# Patient Record
Sex: Female | Born: 1960 | Race: White | Hispanic: No
Health system: Southern US, Community
[De-identification: ages and names within clinical notes are randomized; demographics above are authoritative.]

## PROBLEM LIST (undated history)

## (undated) DIAGNOSIS — F419 Anxiety disorder, unspecified: Secondary | ICD-10-CM

---

## 2018-09-25 DIAGNOSIS — D239 Other benign neoplasm of skin, unspecified: Secondary | ICD-10-CM

## 2018-09-25 HISTORY — DX: Other benign neoplasm of skin, unspecified: D23.9

## 2018-10-21 ENCOUNTER — Other Ambulatory Visit: Payer: Self-pay

## 2018-10-21 DIAGNOSIS — Z20822 Contact with and (suspected) exposure to covid-19: Secondary | ICD-10-CM

## 2018-10-23 LAB — NOVEL CORONAVIRUS, NAA: SARS-CoV-2, NAA: NOT DETECTED

## 2019-03-07 ENCOUNTER — Ambulatory Visit: Payer: Self-pay | Attending: Internal Medicine

## 2019-03-07 DIAGNOSIS — Z23 Encounter for immunization: Secondary | ICD-10-CM | POA: Insufficient documentation

## 2019-03-07 NOTE — Progress Notes (Signed)
   Covid-19 Vaccination Clinic  Name:  Tammy Haynes    MRN: [id number] DOB: May 08, 1960  03/07/2019  [name] was observed post Covid-19 immunization for 15 minutes without incident. She was provided with Vaccine Information Sheet and instruction to access the V-Safe system.   [name] was instructed to call 911 with any severe reactions post vaccine: Marland Kitchen Difficulty breathing  . Swelling of face and throat  . A fast heartbeat  . A bad rash all over body  . Dizziness and weakness

## 2019-04-02 ENCOUNTER — Ambulatory Visit: Payer: Self-pay | Attending: Internal Medicine

## 2019-04-02 DIAGNOSIS — Z23 Encounter for immunization: Secondary | ICD-10-CM

## 2019-04-02 NOTE — Progress Notes (Signed)
   Covid-19 Vaccination Clinic  Name:  Tammy Haynes    MRN: [id number] DOB: 1960-07-13  04/02/2019  Tammy Haynes was observed post Covid-19 immunization for 15 minutes without incident. She was provided with Vaccine Information Sheet and instruction to access the V-Safe system.   Tammy Haynes was instructed to call 911 with any severe reactions post vaccine: Marland Kitchen Difficulty breathing  . Swelling of face and throat  . A fast heartbeat  . A bad rash all over body  . Dizziness and weakness   Immunizations Administered    Name Date Dose VIS Date Route   Pfizer COVID-19 Vaccine 04/02/2019 11:20 AM 0.3 mL 12/13/2018 Intramuscular   Manufacturer: [place]   Lot: [id number]   [place]: [id number]

## 2019-10-06 ENCOUNTER — Ambulatory Visit: Attending: Internal Medicine

## 2019-10-06 DIAGNOSIS — Z23 Encounter for immunization: Secondary | ICD-10-CM

## 2019-10-06 NOTE — Progress Notes (Signed)
   Covid-19 Vaccination Clinic  Name:  Tammy Haynes    MRN: [id number] DOB: 1960/10/01  10/06/2019  [name] was observed post Covid-19 immunization for 15 minutes without incident. She was provided with Vaccine Information Sheet and instruction to access the V-Safe system.   [name] was instructed to call 911 with any severe reactions post vaccine: Marland Kitchen Difficulty breathing  . Swelling of face and throat  . A fast heartbeat  . A bad rash all over body  . Dizziness and weakness

## 2019-10-09 ENCOUNTER — Other Ambulatory Visit: Payer: Self-pay

## 2019-10-09 ENCOUNTER — Ambulatory Visit (INDEPENDENT_AMBULATORY_CARE_PROVIDER_SITE_OTHER): Admitting: Dermatology

## 2019-10-09 ENCOUNTER — Encounter: Payer: Self-pay | Admitting: Dermatology

## 2019-10-09 DIAGNOSIS — D2362 Other benign neoplasm of skin of left upper limb, including shoulder: Secondary | ICD-10-CM | POA: Diagnosis not present

## 2019-10-09 DIAGNOSIS — D2262 Melanocytic nevi of left upper limb, including shoulder: Secondary | ICD-10-CM

## 2019-10-09 DIAGNOSIS — D229 Melanocytic nevi, unspecified: Secondary | ICD-10-CM

## 2019-10-09 DIAGNOSIS — L719 Rosacea, unspecified: Secondary | ICD-10-CM | POA: Diagnosis not present

## 2019-10-09 DIAGNOSIS — D1800 Hemangioma unspecified site: Secondary | ICD-10-CM

## 2019-10-09 DIAGNOSIS — Z1283 Encounter for screening for malignant neoplasm of skin: Secondary | ICD-10-CM

## 2019-10-09 DIAGNOSIS — L814 Other melanin hyperpigmentation: Secondary | ICD-10-CM

## 2019-10-09 DIAGNOSIS — D239 Other benign neoplasm of skin, unspecified: Secondary | ICD-10-CM

## 2019-10-09 DIAGNOSIS — L578 Other skin changes due to chronic exposure to nonionizing radiation: Secondary | ICD-10-CM

## 2019-10-09 DIAGNOSIS — L821 Other seborrheic keratosis: Secondary | ICD-10-CM

## 2019-10-09 DIAGNOSIS — Z86018 Personal history of other benign neoplasm: Secondary | ICD-10-CM

## 2019-10-09 DIAGNOSIS — D2372 Other benign neoplasm of skin of left lower limb, including hip: Secondary | ICD-10-CM

## 2019-10-09 NOTE — Progress Notes (Signed)
Follow-Up Visit   Subjective  Tammy Haynes is a 59 y.o. female who presents for the following: fbse.  Patient here for full body skin exam and skin cancer screening. Patient has a few concerns today on her left upper arm and forehead. Patient has no h/o skin cancer, however does have h/o Mod dysplastic nevus 09/25/18. She does have a rash at the mid-face for which she is not using medication. She reports some irritation of the eyes.  The following portions of the chart were reviewed this encounter and updated as appropriate:  Allergies   Meds   Problems   Med Hx   Surg Hx   Fam Hx       Review of Systems:  No other skin or systemic complaints except as noted in HPI or Assessment and Plan.  Objective  Well appearing patient in no apparent distress; mood and affect are within normal limits.  A full examination was performed including scalp, head, eyes, ears, nose, lips, neck, chest, axillae, abdomen, back, buttocks, bilateral upper extremities, bilateral lower extremities, hands, feet, fingers, toes, fingernails, and toenails. All findings within normal limits unless otherwise noted below.  Objective  Left Upper Arm - Anterior: 0.435 cm round thin brown papule  Objective  Mid face and forehead: Mid face erythema with telangiectasias with few scattered inflammatory papules.   Objective  Left Lower Leg - Anterior, Left Superior Shoulder: Firm pink/brown papulenodule with dimple sign.    Assessment & Plan  Nevus Left Upper Arm - Anterior  Benign-appearing.  Observation.  Call clinic for new or changing lesions.  Recommend daily use of broad spectrum spf 30+ sunscreen to sun-exposed areas.    Rosacea Mid face and forehead  Chronic, flared  Patient ws given Soolantra to try on face Patient states that she has dry irritated eyes. Recommend evaluation with ophthalmology for possible ocular rosacea. Start Doxycycline 20mg  BID  Doxycycline should be taken with food to  prevent nausea. Do not lay down for 30 minutes after taking. Be cautious with sun exposure and use good sun protection while on this medication. Pregnant women should not take this medication.    doxycycline (PERIOSTAT) 20 MG tablet - Mid face and forehead  Dermatofibroma (2) Left Superior Shoulder; Left Lower Leg - Anterior  Benign-appearing.  Observation.  Call clinic for new or changing lesions.  Recommend daily use of broad spectrum spf 30+ sunscreen to sun-exposed areas.     Lentigines - Scattered tan macules - Discussed due to sun exposure - Benign, observe - Call for any changes  Seborrheic Keratoses - Stuck-on, waxy, tan-brown papules and plaques  - Discussed benign etiology and prognosis. - Observe - Call for any changes  Melanocytic Nevi - Tan-brown and/or pink-flesh-colored symmetric macules and papules - Benign appearing on exam today - Observation - Call clinic for new or changing moles - Recommend daily use of broad spectrum spf 30+ sunscreen to sun-exposed areas.   Hemangiomas - Red papules - Discussed benign nature - Observe - Call for any changes  Actinic Damage - diffuse scaly erythematous macules with underlying dyspigmentation - Recommend daily broad spectrum sunscreen SPF 30+ to sun-exposed areas, reapply every 2 hours as needed.  - Call for new or changing lesions.  Skin cancer screening performed today.  History of Dysplastic Nevi - No evidence of recurrence today - Recommend regular full body skin exams - Recommend daily broad spectrum sunscreen SPF 30+ to sun-exposed areas, reapply every 2 hours as needed.  -  Call if any new or changing lesions are noted between office visits   Return in about 1 year (around 10/08/2020) for TBSE.  I, Donzetta Kohut, CMA, am acting as scribe for Forest Gleason, MD .  Documentation: I have reviewed the above documentation for accuracy and completeness, and I agree with the above.  Forest Gleason, MD

## 2019-10-09 NOTE — Patient Instructions (Addendum)
Recommend daily broad spectrum sunscreen SPF 30+ to sun-exposed areas, reapply every 2 hours as needed. Call for new or changing lesions.    Recommend taking Heliocare sun protection supplement daily in sunny weather for additional sun protection. For maximum protection on the sunniest days, you can take up to 2 capsules of regular Heliocare OR take 1 capsule of Heliocare Ultra. For prolonged exposure (such as a full day in the sun), you can repeat your dose of the supplement 4 hours after your first dose. Heliocare can be purchased at [facility] or at [email or web address].     Melanoma ABCDEs  Melanoma is the most dangerous type of skin cancer, and is the leading cause of death from skin disease.  You are more likely to develop melanoma if you:  Have light-colored skin, light-colored eyes, or red or blond hair  Spend a lot of time in the sun  Tan regularly, either outdoors or in a tanning bed  Have had blistering sunburns, especially during childhood  Have a close family member who has had a melanoma  Have atypical moles or large birthmarks  Early detection of melanoma is key since treatment is typically straightforward and cure rates are extremely high if we catch it early.   The first sign of melanoma is often a change in a mole or a new dark spot.  The ABCDE system is a way of remembering the signs of melanoma.  A for asymmetry:  The two halves do not match. B for border:  The edges of the growth are irregular. C for color:  A mixture of colors are present instead of an even brown color. D for diameter:  Melanomas are usually (but not always) greater than 6mm - the size of a pencil eraser. E for evolution:  The spot keeps changing in size, shape, and color.  Please check your skin once per month between visits. You can use a small mirror in front and a large mirror behind you to keep an eye on the back side or your body.   If you see any new or changing lesions before  your next follow-up, please call to schedule a visit.  Please continue daily skin protection including broad spectrum sunscreen SPF 30+ to sun-exposed areas, reapplying every 2 hours as needed when you're outdoors.    

## 2019-10-13 ENCOUNTER — Telehealth: Payer: Self-pay

## 2019-10-13 MED ORDER — DOXYCYCLINE HYCLATE 20 MG PO TABS: 20.0000 mg | ORAL_TABLET | Freq: Two times a day (BID) | ORAL | 1 refills | Status: AC

## 2019-10-13 NOTE — Telephone Encounter (Signed)
Patient states she was here last week and was given a sample of a rosacea cream that seems to be making her rosacea worse causing acne papules to come out. She also thought she was getting a Doxycycline RX. Can you please advise as I dont see this in the note?  [place]

## 2019-10-13 NOTE — Telephone Encounter (Signed)
Called patient and advised here that the doxycycline 50mg  should be at her pharmacy for her to pick up, was resent in today. Also advised her that if she was forehead was getting worse with the Soolantra to stop using but to take the Doxycycline 50mg  as soon as she picks it up

## 2019-10-14 NOTE — Telephone Encounter (Signed)
Thank you :)

## 2019-10-28 ENCOUNTER — Encounter: Payer: Self-pay | Admitting: Dermatology

## 2019-10-28 ENCOUNTER — Telehealth: Payer: Self-pay

## 2019-10-28 NOTE — Telephone Encounter (Signed)
Did she like the Wister sample we gave her to try?  If so, we can prescribe that and see if her insurance will cover it. Otherwise we could consider [facility] metronidazole ivermectin azelaic acid which is my favorite topical for rosacea- It is around $55 out-of-pocket.  I am fine with sending either for her.  One final option just so she knows it is available - there is a slow release doxycycline called Oracea that usually causes less stomach upset and her insurance may pay for but I cannot guarantee how well she would tolerate it. If we have any samples, we could offer her a sample.

## 2019-10-28 NOTE — Telephone Encounter (Signed)
Patient called and left message regarding Doxycycline. She states the medication makes her stomach upset and causes a flare in her acid reflux. Patient would like to try another topical medication for her rosacea.

## 2019-10-29 NOTE — Telephone Encounter (Signed)
Spoke with patient regarding information per Dr. Laurence Ferrari. She would like to try the compounded cream at [facility]. Patient advised to look for an email with further instructions on ordering and paying for medication.  RX sent in.

## 2020-01-07 ENCOUNTER — Other Ambulatory Visit: Payer: Self-pay

## 2020-01-07 ENCOUNTER — Ambulatory Visit (INDEPENDENT_AMBULATORY_CARE_PROVIDER_SITE_OTHER): Admitting: Dermatology

## 2020-01-07 DIAGNOSIS — L719 Rosacea, unspecified: Secondary | ICD-10-CM

## 2020-01-07 NOTE — Patient Instructions (Addendum)
Rosacea  What is rosacea? Rosacea (say: ro-zay-sha) is a common skin disease that usually begins as a trend of flushing or blushing easily.  As rosacea progresses, a persistent redness in the center of the face will develop and may gradually spread beyond the nose and cheeks to the forehead and chin.  In some cases, the ears, chest, and back could be affected.  Rosacea may appear as tiny blood vessels or small red bumps that occur in crops.  Frequently they can contain pus, and are called "pustules".  If the bumps do not contain pus, they are referred to as "papules".  Rarely, in prolonged, untreated cases of rosacea, the oil glands of the nose and cheeks may become permanently enlarged.  This is called rhinophyma, and is seen more frequently in men.  Signs and Risks In its beginning stages, rosacea tends to come and go, which makes it difficult to recognize.  It can start as intermittent flushing of the face.  Eventually, blood vessels may become permanently visible.  Pustules and papules can appear, but can be mistaken for adult acne.  People of all races, ages, genders and ethnic groups are at risk of developing rosacea.  However, it is more common in women (especially around menopause) and adults with fair skin between the ages of 30 and 50.  Treatment Dermatologists typically recommend a combination of treatments to effectively manage rosacea.  Treatment can improve symptoms and may stop the progression of the rosacea.  Treatment may involve both topical and oral medications.  The tetracycline antibiotics are often used for their anti-inflammatory effect; however, because of the possibility of developing antibiotic resistance, they should not be used long term at full dose.  For dilated blood vessels the options include electrodessication (uses electric current through a small needle), laser treatment, and cosmetics to hide the redness.   With all forms of treatment, improvement is a slow process, and  patients may not see any results for the first 3-4 weeks.  It is very important to avoid the sun and other triggers.  Patients must wear sunscreen daily.  Skin Care Instructions: 1. Cleanse the skin with a mild soap such as CeraVe cleanser, Cetaphil cleanser, or Dove soap once or twice daily as needed. 2. Moisturize with Eucerin Redness Relief Daily Perfecting Lotion (has a subtle green tint), CeraVe Moisturizing Cream, or Oil of Olay Daily Moisturizer with sunscreen every morning and/or night as recommended. 3. Makeup should be "non-comedogenic" (won't clog pores) and be labeled "for sensitive skin". Good choices for cosmetics are: Neutrogena, Almay, and Physician's Formula.  Any product with a green tint tends to offset a red complexion. 4. If your eyes are dry and irritated, use artificial tears 2-3 times per day and cleanse the eyelids daily with baby shampoo.  Have your eyes examined at least every 2 years.  Be sure to tell your eye doctor that you have rosacea. 5. Alcoholic beverages tend to cause flushing of the skin, and may make rosacea worse. 6. Always wear sunscreen, protect your skin from extreme hot and cold temperatures, and avoid spicy foods, hot drinks, and mechanical irritation such as rubbing, scrubbing, or massaging the face.  Avoid harsh skin cleansers, cleansing masks, astringents, and exfoliation. If a particular product burns or makes your face feel tight, then it is likely to flare your rosacea. 7. If you are having difficulty finding a sunscreen that you can tolerate, you may try switching to a chemical-free sunscreen.  These are ones whose active   ingredient is zinc oxide or titanium dioxide only.  They should also be fragrance free, non-comedogenic, and labeled for sensitive skin. 8. Rosacea triggers may vary from person to person.  There are a variety of foods that have been reported to trigger rosacea.  Some patients find that keeping a diary of what they were doing when they  flared helps them avoid triggers.  Recommend daily broad spectrum sunscreen SPF 30+ to sun-exposed areas, reapply every 2 hours as needed. Call for new or changing lesions.  Gentle Skin Care Guide  1. Bathe no more than once a day.  2. Avoid bathing in hot water  3. Use a mild soap like Dove, Vanicream, Cetaphil, CeraVe. Can use Lever 2000 or Cetaphil antibacterial soap  4. Use soap only where you need it. On most days, use it under your arms, between your legs, and on your feet. Let the water rinse other areas unless visibly dirty.  5. When you get out of the bath/shower, use a towel to gently blot your skin dry, don't rub it.  6. While your skin is still a little damp, apply a moisturizing cream such as Vanicream, CeraVe, Cetaphil, Eucerin, Sarna lotion or plain Vaseline Jelly. For hands apply Neutrogena [place] Hand Cream or Excipial Hand Cream.  7. Reapply moisturizer any time you start to itch or feel dry.  8. Sometimes using free and clear laundry detergents can be helpful. Fabric softener sheets should be avoided. Downy Free & Gentle liquid, or any liquid fabric softener that is free of dyes and perfumes, it acceptable to use  9. If your doctor has given you prescription creams you may apply moisturizers over them    Instructions for [facility] Medications  One or more of your medications was sent to the [facility] mail order compounding pharmacy. You will receive an email from them and can purchase the medicine through that link. It will then be mailed to your home at the address you confirmed. If for any reason you do not receive an email from them, please check your spam folder. If you still do not find the email, please let us know. [facility] phone number is [phone].

## 2020-01-07 NOTE — Progress Notes (Signed)
   Follow-Up Visit   Subjective  Tammy Haynes is a 60 y.o. female who presents for the following: Follow-up (Follow up on Rosacea on mid face and forehead. Treated with doxycycline 20 mg tablet twice daily. Patient reports doxycycline caused some gastro issues and issue was not resolved until 2 weeks after completing medication. Patient states areas are about the same. Patient was also prescribed [facility] metronidazole ivermectin azelaic acidis also compound cream. She states cream works but as soon as she stops taking areas appear. ).  The following portions of the chart were reviewed this encounter and updated as appropriate:  Allergies  Meds  Problems  Med Hx  Surg Hx  Fam Hx      Objective  Well appearing patient in no apparent distress; mood and affect are within normal limits.  A focused examination was performed including face. Relevant physical exam findings are noted in the Assessment and Plan.  Objective  Head - Anterior (Face): Mid face erythema with few small inflammatory papules   Assessment & Plan  Rosacea Head - Anterior (Face)  Chronic condition with expected duration over one year. Currently well-controlled.  Reviewed chronic nature - no cure, only control.  Rosacea is a chronic progressive skin condition usually affecting the face of adults, causing redness and/or acne bumps. It is treatable but not curable. It sometimes affects the eyes (ocular rosacea) as well. It may respond to topical and/or systemic medication and can flare with stress, sun exposure, alcohol, exercise and some foods.  Daily application of broad spectrum spf 30+ sunscreen to face is recommended to reduce flares.  [facility] metronidazole ivermectin azelaic acid   Discontinued doxycycline due to gastric upset   Discussed BBL light treatment  Return for follow up as scheduled and as needed .   I, Asher Muir, CMA, am acting as scribe for  Darden Dates, MD.  Documentation: I have reviewed the above documentation for accuracy and completeness, and I agree with the above.  Darden Dates, MD

## 2020-01-27 ENCOUNTER — Encounter: Payer: Self-pay | Admitting: Dermatology

## 2020-03-04 ENCOUNTER — Other Ambulatory Visit: Payer: Self-pay

## 2020-03-04 ENCOUNTER — Encounter: Payer: Self-pay | Admitting: Emergency Medicine

## 2020-03-04 ENCOUNTER — Emergency Department

## 2020-03-04 ENCOUNTER — Emergency Department
Admission: EM | Admit: 2020-03-04 | Discharge: 2020-03-04 | Disposition: A | Discharge status: Home / Self Care | Attending: Emergency Medicine | Admitting: Emergency Medicine

## 2020-03-04 DIAGNOSIS — R079 Chest pain, unspecified: Secondary | ICD-10-CM | POA: Diagnosis present

## 2020-03-04 DIAGNOSIS — F419 Anxiety disorder, unspecified: Secondary | ICD-10-CM | POA: Diagnosis not present

## 2020-03-04 HISTORY — DX: Anxiety disorder, unspecified: F41.9

## 2020-03-04 LAB — COMPREHENSIVE METABOLIC PANEL
ALT: 58 U/L — ABNORMAL HIGH (ref 0–44)
AST: 34 U/L (ref 15–41)
Albumin: 4.6 g/dL (ref 3.5–5.0)
Alkaline Phosphatase: 41 U/L (ref 38–126)
Anion gap: 9 (ref 5–15)
BUN: 18 mg/dL (ref 6–20)
CO2: 25 mmol/L (ref 22–32)
Calcium: 9.5 mg/dL (ref 8.9–10.3)
Chloride: 107 mmol/L (ref 98–111)
Creatinine, Ser: 0.68 mg/dL (ref 0.44–1.00)
GFR, Estimated: >60 mL/min (ref >60)
Glucose, Bld: 100 mg/dL — ABNORMAL HIGH (ref 70–99)
Potassium: 4.1 mmol/L (ref 3.5–5.1)
Sodium: 141 mmol/L (ref 135–145)
Total Bilirubin: 0.6 mg/dL (ref 0.3–1.2)
Total Protein: 7.5 g/dL (ref 6.5–8.1)

## 2020-03-04 LAB — CBC
HCT: 44.2 % (ref 36.0–46.0)
Hemoglobin: 15 g/dL (ref 12.0–15.0)
MCH: 29.5 pg (ref 26.0–34.0)
MCHC: 33.9 g/dL (ref 30.0–36.0)
MCV: 86.8 fL (ref 80.0–100.0)
Platelets: 245 10*3/uL (ref 150–400)
RBC: 5.09 MIL/uL (ref 3.87–5.11)
RDW: 12.2 % (ref 11.5–15.5)
WBC: 5.4 10*3/uL (ref 4.0–10.5)
nRBC: 0 % (ref 0.0–0.2)

## 2020-03-04 LAB — TROPONIN I (HIGH SENSITIVITY)
Troponin I (High Sensitivity): 4 ng/L (ref <18)
Troponin I (High Sensitivity): 3 ng/L (ref <18)

## 2020-03-04 NOTE — ED Notes (Signed)
Per dr. Alfred Levins, okay to straight stick vs IV placement

## 2020-03-04 NOTE — ED Notes (Signed)
Dr. Veronese at bedside 

## 2020-03-04 NOTE — ED Triage Notes (Signed)
Pt in with co midsternal chest pain that started 20 min prior to coming. STates has hx of anxiety and has been upset today. Pt denies any cardiac history or recent illness.

## 2020-03-04 NOTE — ED Provider Notes (Signed)
[facility] Emergency Department Provider Note  ____________________________________________  Time seen: Approximately 6:02 AM  I have reviewed the triage vital signs and the nursing notes.   HISTORY  Chief Complaint Chest Pain   HPI Tammy Haynes is a 60 y.o. female with history of anxiety who presents for evaluation of chest pain.  Patient reports that she woke up at 2 AM with chest pain.  She describes the pain as dull substernal constant for about an hour with no radiation and no associated symptoms.  She specifically denies dizziness, diaphoresis, nausea, vomiting, shortness of breath.  Patient reports being under a lot of stress.  She returned yesterday from [place] after going to visit her parents.  She reports that her parents have a lot of ongoing issues and she could not deal with the amount of stress and return a day early.  She denies any history of coronary artery disease, PE or DVT, leg pain or swelling or hemoptysis.  She reports that the chest pain has fully resolved after an hour with no interventions.  She is not a smoker.  No abdominal pain.   Past Medical History:  Diagnosis Date  . Anxiety   . Dysplastic nevus 09/25/2018   Right medial ant. thigh. Moderate atypia, limited margins free.     There are no problems to display for this patient.   No past surgical history on file.  Prior to Admission medications   Medication Sig Start Date End Date Taking? Authorizing Provider  ALPRAZolam Duanne Moron) 0.25 MG tablet Take by mouth. 08/07/19   [provider]  escitalopram (LEXAPRO) 20 MG tablet Take 1 tablet by mouth daily. 08/03/19   [provider]  estradiol (CLIMARA - DOSED IN MG/24 HR) 0.05 mg/24hr patch PLACE 1 PATCH ON THE SKIN ONCE A WEEK 08/20/19   [provider]  levothyroxine (SYNTHROID) 25 MCG tablet Take by mouth. 08/08/19   [provider]  pantoprazole (PROTONIX) 40 MG tablet Take by mouth. 08/08/19    [provider]    Allergies Patient has no known allergies.  No family history on file.  Social History    Review of Systems  Constitutional: Negative for fever. Eyes: Negative for visual changes. ENT: Negative for sore throat. Neck: No neck pain  Cardiovascular: + chest pain. Respiratory: Negative for shortness of breath. Gastrointestinal: Negative for abdominal pain, vomiting or diarrhea. Genitourinary: Negative for dysuria. Musculoskeletal: Negative for back pain. Skin: Negative for rash. Neurological: Negative for headaches, weakness or numbness. Psych: No SI or HI  ____________________________________________   PHYSICAL EXAM:  VITAL SIGNS: Vitals:   03/04/20 0521 03/04/20 0630  BP: 127/65 (!) 117/59  Pulse: 79 72  Resp: 19 17  Temp:    SpO2: 98% 99%    Constitutional: Alert and oriented. Well appearing and in no apparent distress. HEENT:      Head: Normocephalic and atraumatic.         Eyes: Conjunctivae are normal. Sclera is non-icteric.       Mouth/Throat: Mucous membranes are moist.       Neck: Supple with no signs of meningismus. Cardiovascular: Regular rate and rhythm. No murmurs, gallops, or rubs. 2+ symmetrical distal pulses are present in all extremities. No JVD. Respiratory: Normal respiratory effort. Lungs are clear to auscultation bilaterally.  Gastrointestinal: Soft, non tender, and non distended with positive bowel sounds. No rebound or guarding. Genitourinary: No CVA tenderness. Musculoskeletal:  No edema, cyanosis, or erythema of extremities. Neurologic: Normal speech  and language. Face is symmetric. Moving all extremities. No gross focal neurologic deficits are appreciated. Skin: Skin is warm, dry and intact. No rash noted. Psychiatric: Mood and affect are normal. Speech and behavior are normal.  ____________________________________________   LABS (all labs ordered are listed, but only abnormal results are displayed)  Labs  Reviewed  COMPREHENSIVE METABOLIC PANEL - Abnormal; Notable for the following components:      Result Value   Glucose, Bld 100 (*)    ALT 58 (*)    All other components within normal limits  CBC  TROPONIN I (HIGH SENSITIVITY)  TROPONIN I (HIGH SENSITIVITY)   ____________________________________________  EKG  ED ECG REPORT I, Rudene Re, the attending physician, personally viewed and interpreted this ECG.  Sinus rhythm, rate of 73, normal intervals, normal axis, no ST elevations or depressions.  No prior for comparison ____________________________________________  RADIOLOGY  I have personally reviewed the images performed during this visit and I agree with the Radiologist's read.  ____________________________________________   PROCEDURES  Procedure(s) performed:yes .1-3 Lead EKG Interpretation Performed by: Rudene Re, MD Authorized by: Rudene Re, MD     Interpretation: non-specific     ECG rate assessment: normal     Rhythm: sinus rhythm     Ectopy: none     Conduction: normal     Critical Care performed:  None ____________________________________________   INITIAL IMPRESSION / ASSESSMENT AND PLAN / ED COURSE   60 y.o. female with history of anxiety who presents for evaluation of chest pain.  Patient symptoms have fully resolved.  Patient reports being very anxious and under a lot of stress over the last few days.  No history of coronary artery disease or smoking.    Ddx anxiety, indigestion, ACS.  Initial EKG with no acute ischemic changes.  PERC negative.  Low risk per heart score but will get 2 high-sensitivity troponins for further stratification. Will monitor on telemetry for recurrence of her symptoms or any dysrhythmias.  Labs with no significant abnormalities so far including CBC and chemistry panel.  History gathered from patient and her husband who was at bedside.  Old medical records review   _________________________ 6:51 AM  on 03/04/2020 -----------------------------------------  Patient remains pain-free.  Second troponin is negative.  At this time believe she is stable for discharge and follow-up with PCP.  Recommended returning to the emergency room if chest pain recurs.  Most likely an anxiety attack.  Plan discussed with patient and her husband    _____________________________________________ Please note:  Patient was evaluated in Emergency Department today for the symptoms described in the history of present illness. Patient was evaluated in the context of the global COVID-19 pandemic, which necessitated consideration that the patient might be at risk for infection with the SARS-CoV-2 virus that causes COVID-19. Institutional protocols and algorithms that pertain to the evaluation of patients at risk for COVID-19 are in a state of rapid change based on information released by regulatory bodies including the CDC and federal and state organizations. These policies and algorithms were followed during the patient's care in the ED.  Some ED evaluations and interventions may be delayed as a result of limited staffing during the pandemic.   [place] Controlled Substance Database was reviewed by me. ____________________________________________   FINAL CLINICAL IMPRESSION(S) / ED DIAGNOSES   Final diagnoses:  Chest pain, unspecified type  Anxiety      NEW MEDICATIONS STARTED DURING THIS VISIT:  ED Discharge Orders    None  Note:  This document was prepared using Dragon voice recognition software and may include unintentional dictation errors.    Alfred Levins, [place], MD 03/12/20 [phone]

## 2020-03-04 NOTE — Discharge Instructions (Addendum)

## 2020-10-13 ENCOUNTER — Encounter: Admitting: Dermatology

## 2021-01-27 ENCOUNTER — Ambulatory Visit (INDEPENDENT_AMBULATORY_CARE_PROVIDER_SITE_OTHER): Admitting: Dermatology

## 2021-01-27 ENCOUNTER — Encounter: Payer: Self-pay | Admitting: Dermatology

## 2021-01-27 ENCOUNTER — Other Ambulatory Visit: Payer: Self-pay

## 2021-01-27 DIAGNOSIS — L820 Inflamed seborrheic keratosis: Secondary | ICD-10-CM | POA: Diagnosis not present

## 2021-01-27 DIAGNOSIS — D2372 Other benign neoplasm of skin of left lower limb, including hip: Secondary | ICD-10-CM

## 2021-01-27 DIAGNOSIS — D1800 Hemangioma unspecified site: Secondary | ICD-10-CM

## 2021-01-27 DIAGNOSIS — L578 Other skin changes due to chronic exposure to nonionizing radiation: Secondary | ICD-10-CM | POA: Diagnosis not present

## 2021-01-27 DIAGNOSIS — L821 Other seborrheic keratosis: Secondary | ICD-10-CM

## 2021-01-27 DIAGNOSIS — D2262 Melanocytic nevi of left upper limb, including shoulder: Secondary | ICD-10-CM | POA: Diagnosis not present

## 2021-01-27 DIAGNOSIS — Z86018 Personal history of other benign neoplasm: Secondary | ICD-10-CM

## 2021-01-27 DIAGNOSIS — L814 Other melanin hyperpigmentation: Secondary | ICD-10-CM

## 2021-01-27 DIAGNOSIS — L719 Rosacea, unspecified: Secondary | ICD-10-CM | POA: Diagnosis not present

## 2021-01-27 DIAGNOSIS — Z1283 Encounter for screening for malignant neoplasm of skin: Secondary | ICD-10-CM

## 2021-01-27 DIAGNOSIS — D229 Melanocytic nevi, unspecified: Secondary | ICD-10-CM

## 2021-01-27 NOTE — Progress Notes (Signed)
Follow-Up Visit   Subjective  Tammy Haynes is a 61 y.o. female who presents for the following: Annual Exam (Here for skin cancer screening. Hx of DN, moderate atypia 09/2018, at right medial thigh. No personal Hx of skin cancer. Currently not treating her rosacea).  The patient presents for Total-Body Skin Exam (TBSE) for skin cancer screening and mole check.  The patient has spots, moles and lesions to be evaluated, some may be new or changing and the patient has concerns that these could be cancer.   The following portions of the chart were reviewed this encounter and updated as appropriate:  Allergies   Meds   Problems   Med Hx   Surg Hx   Fam Hx       Review of Systems: No other skin or systemic complaints except as noted in HPI or Assessment and Plan.   Objective  Well appearing patient in no apparent distress; mood and affect are within normal limits.  A full examination was performed including scalp, head, eyes, ears, nose, lips, neck, chest, axillae, abdomen, back, buttocks, bilateral upper extremities, bilateral lower extremities, hands, feet, fingers, toes, fingernails, and toenails. All findings within normal limits unless otherwise noted below.  Head - Anterior (Face) Mild mid face macular erythema  Left Cheek Erythematous keratotic or waxy stuck-on papule or plaque.  Left Upper Arm - Anterior 0.45 cm round thin brown papule   Assessment & Plan   Lentigines - Scattered tan macules - Due to sun exposure - Benign-appearing, observe - Recommend daily broad spectrum sunscreen SPF 30+ to sun-exposed areas, reapply every 2 hours as needed. - Call for any changes  Seborrheic Keratoses - Stuck-on, waxy, tan-brown papules and/or plaques  - Benign-appearing - Discussed benign etiology and prognosis. - Observe - Call for any changes  Melanocytic Nevi - Tan-brown and/or pink-flesh-colored symmetric macules and papules - Benign appearing on exam today -  Observation - Call clinic for new or changing moles - Recommend daily use of broad spectrum spf 30+ sunscreen to sun-exposed areas.   Hemangiomas - Red papules - Discussed benign nature - Observe - Call for any changes  Actinic Damage - Chronic condition, secondary to cumulative UV/sun exposure - diffuse scaly erythematous macules with underlying dyspigmentation - Recommend daily broad spectrum sunscreen SPF 30+ to sun-exposed areas, reapply every 2 hours as needed.  - Staying in the shade or wearing long sleeves, sun glasses (UVA+UVB protection) and wide brim hats (4-inch brim around the entire circumference of the hat) are also recommended for sun protection.  - Call for new or changing lesions.  History of Dysplastic Nevus - No evidence of recurrence today at right medial thigh - Recommend regular full body skin exams - Recommend daily broad spectrum sunscreen SPF 30+ to sun-exposed areas, reapply every 2 hours as needed.  - Call if any new or changing lesions are noted between office visits   Dermatofibroma - Firm pink/brown papulenodules with dimple sign at left pretibia and left calf (x3) - Benign appearing - Call for any changes  Skin cancer screening performed today.  Rosacea Head - Anterior (Face)  Chronic condition with duration or expected duration over one year. Currently well-controlled.  Rosacea is a chronic progressive skin condition usually affecting the face of adults, causing redness and/or acne bumps. It is treatable but not curable. It sometimes affects the eyes (ocular rosacea) as well. It may respond to topical and/or systemic medication and can flare with stress, sun exposure, alcohol, exercise  and some foods.  Daily application of broad spectrum spf 30+ sunscreen to face is recommended to reduce flares.  D/c [facility] azelaic acid-ivermectin/metronidazole and Start prescription Oxymetazoline ivermectin QAM prn flares. [facility] to [facility].  Inflamed seborrheic keratosis Left Cheek  Reviewed risk of dyspigmentation, scar, incomplete removal, risk of recurrence   Destruction of lesion - Left Cheek Complexity: simple   Destruction method: electrodesiccation and curettage   Destruction method comment:  Electrodesiccation only Informed consent: discussed and consent obtained   Timeout:  patient name, date of birth, surgical site, and procedure verified Anesthesia: the lesion was anesthetized in a standard fashion   Anesthetic:  1% lidocaine w/ epinephrine 1-100,000 buffered w/ 8.4% NaHCO3 Hemostasis achieved with:  electrodesiccation Outcome: patient tolerated procedure well with no complications   Post-procedure details: wound care instructions given   Additional details:  Prior to procedure, discussed risks of small wound, skin dyspigmentation, or rare scar following electrodesiccation. Recommend Vaseline ointment to treated areas while healing.   Nevus Left Upper Arm - Anterior  Benign-appearing.  Observation.  Call clinic for new or changing lesions.  Recommend daily use of broad spectrum spf 30+ sunscreen to sun-exposed areas.     Return in about 1 year (around 01/27/2022) for TBSE.  I, Emelia Salisbury, CMA, am acting as scribe for Forest Gleason, MD.  Documentation: I have reviewed the above documentation for accuracy and completeness, and I agree with the above.  Forest Gleason, MD

## 2021-01-27 NOTE — Patient Instructions (Addendum)
Melanoma ABCDEs  Melanoma is the most dangerous type of skin cancer, and is the leading cause of death from skin disease.  You are more likely to develop melanoma if you: Have light-colored skin, light-colored eyes, or red or blond hair Spend a lot of time in the sun Tan regularly, either outdoors or in a tanning bed Have had blistering sunburns, especially during childhood Have a close family member who has had a melanoma Have atypical moles or large birthmarks  Early detection of melanoma is key since treatment is typically straightforward and cure rates are extremely high if we catch it early.   The first sign of melanoma is often a change in a mole or a new dark spot.  The ABCDE system is a way of remembering the signs of melanoma.  A for asymmetry:  The two halves do not match. B for border:  The edges of the growth are irregular. C for color:  A mixture of colors are present instead of an even brown color. D for diameter:  Melanomas are usually (but not always) greater than 52mm - the size of a pencil eraser. E for evolution:  The spot keeps changing in size, shape, and color.  Please check your skin once per month between visits. You can use a small mirror in front and a large mirror behind you to keep an eye on the back side or your body.   If you see any new or changing lesions before your next follow-up, please call to schedule a visit.  Please continue daily skin protection including broad spectrum sunscreen SPF 30+ to sun-exposed areas, reapplying every 2 hours as needed when you're outdoors.   Staying in the shade or wearing long sleeves, sun glasses (UVA+UVB protection) and wide brim hats (4-inch brim around the entire circumference of the hat) are also recommended for sun protection.     Recommend taking Heliocare sun protection supplement daily in sunny weather for additional sun protection. For maximum protection on the sunniest days, you can take up to 2 capsules of  regular Heliocare OR take 1 capsule of Heliocare Ultra. For prolonged exposure (such as a full day in the sun), you can repeat your dose of the supplement 4 hours after your first dose. Heliocare can be purchased at [employer], at some [facility] or at [email or web address].    Some Recommended Sunscreens Include:  Good for Daily Wear (feels like lotion but NOT sweat resistant) Cerave AM Moisturizer with SPF EltaMD UV Lotion  Body or All Over Sunscreen EltaMD UV active for body and face Blue lizard sensitive Sun bum mineral (avoid if sensitive to scent) Aveeno Positively Mineral Neutrogena sheer zinc (Slightly harder to rub in) [facility] clear zinc (Slightly harder to rub in)  Clear Face Sunscreen EltaMD UV Elements CeraVe hydrating sunscreen 50 face  Tinted Face Sunscreen Alastin Hydratint (good for most skin tones, may be slightly dark if you are very fair) Colorescience Sunforgettable Total Protection Face Shield (good for most skin tones) EltaMD UV Physical La Roche Posay Mineral Tinted Cotz Flawless Complexion   Powder Sunscreen (Nice for reapplying or applying on the go) Colorescience Sunforgettable Total Protection Brush on Shield (available in different tints)  Face Sunscreen Available in Different Tints Colorescience Sunforgettable Total Protection Brush on Shield  bareMinerals Complexion Rescue Tinted Hydrating Gel Cream Broad Spectrum SPF 30 UnSun mineral tinted (comes in medium/dark and light/medium)  Kids (over 6 months) - Mineral Sunscreens Recommended eltaMD UV Pure MDsolarSciences KidStick 40 SPF Aveeno  Baby Continuous Protection Sensitive Zinc Oxide Blue Lizard Kids mineral based sunscreen lotion Mustela Mineral Sunscreen for face and body Neutrogena Sheer Zinc Kids Sunscreen Stick  Tinted to look like a tan PCAskin sheer tint body spray   If You Need Anything After Your Visit  If you have any questions or concerns for your doctor, please call our main  line at [phone] and press option 4 to reach your doctor's medical assistant. If no one answers, please leave a voicemail as directed and we will return your call as soon as possible. Messages left after 4 pm will be answered the following business day.   You may also send [place] a message via [place]. We typically respond to MyChart messages within 1-2 business days.  For prescription refills, please ask your pharmacy to contact our office. Our fax number is [phone].  If you have an urgent issue when the clinic is closed that cannot wait until the next business day, you can page your doctor at the number below.    Please note that while we do our best to be available for urgent issues outside of office hours, we are not available 24/7.   If you have an urgent issue and are unable to reach [place], you may choose to seek medical care at your doctor's office, retail clinic, urgent care center, or emergency room.  If you have a medical emergency, please immediately call 911 or go to the emergency department.  Pager Numbers  - Dr. Nehemiah Massed: [phone]  - Dr. Laurence Ferrari: [phone]  - Dr. Nicole Kindred: [phone]  In the event of inclement weather, please call our main line at [phone] for an update on the status of any delays or closures.  Dermatology Medication Tips: Please keep the boxes that topical medications come in in order to help keep track of the instructions about where and how to use these. Pharmacies typically print the medication instructions only on the boxes and not directly on the medication tubes.   If your medication is too expensive, please contact our office at [phone] option 4 or send [place] a message through [place].   We are unable to tell what your co-pay for medications will be in advance as this is different depending on your insurance coverage. However, we may be able to find a substitute medication at lower cost or fill out paperwork to get insurance to cover a  needed medication.   If a prior authorization is required to get your medication covered by your insurance company, please allow [place] 1-2 business days to complete this process.  Drug prices often vary depending on where the prescription is filled and some pharmacies may offer cheaper prices.  The website www.goodrx.com contains coupons for medications through different pharmacies. The prices here do not account for what the cost may be with help from insurance (it may be cheaper with your insurance), but the website can give you the price if you did not use any insurance.  - You can print the associated coupon and take it with your prescription to the pharmacy.  - You may also stop by our office during regular business hours and pick up a GoodRx coupon card.  - If you need your prescription sent electronically to a different pharmacy, notify our office through [facility] or by phone at [phone] option 4.     Si Usted Necesita Algo Despus de Su Visita  Tambin puede enviarnos un mensaje a travs de Pharmacist, community. Por lo general respondemos a  los mensajes de MyChart en el transcurso de 1 a 2 das hbiles.  Para renovar recetas, por favor pida a su farmacia que se ponga en contacto con nuestra oficina. Harland Dingwall de fax es [place] [phone].  Si tiene un asunto urgente cuando la clnica est cerrada y que no puede esperar hasta el siguiente da hbil, puede llamar/localizar a su doctor(a) al nmero que aparece a continuacin.   Por favor, tenga en cuenta que aunque hacemos todo lo posible para estar disponibles para asuntos urgentes fuera del horario de [place], no estamos disponibles las 24 horas del da, los 7 das de la [place].   Si tiene un problema urgente y no puede comunicarse con nosotros, puede optar por buscar atencin mdica  en el consultorio de su doctor(a), en una clnica privada, en un centro de atencin urgente o en una sala de emergencias.  Si tiene Architect, por favor llame inmediatamente al 911 o vaya a la sala de emergencias.  Nmeros de bper  - Dr. Nehemiah Massed: [phone]  - Dra. Moye: [phone]  - Dra. Nicole Kindred: [phone]  En caso de inclemencias del [place], por favor llame a Johnsie Kindred principal al [phone] para una actualizacin sobre el [place] de cualquier retraso o cierre.  Consejos para la medicacin en dermatologa: Por favor, guarde las cajas en las que vienen los medicamentos de uso tpico para ayudarle a seguir las instrucciones sobre dnde y cmo usarlos. Las farmacias generalmente imprimen las instrucciones del medicamento slo en las cajas y no directamente en los tubos del [place].   Si su medicamento es muy caro, por favor, pngase en contacto con Zigmund Daniel llamando al [phone] y presione la opcin 4 o envenos un mensaje a travs de Pharmacist, community.   No podemos decirle cul ser su copago por los medicamentos por adelantado ya que esto es diferente dependiendo de la cobertura de su seguro. Sin embargo, es posible que podamos encontrar un medicamento sustituto a Electrical engineer un formulario para que el seguro cubra el medicamento que se considera necesario.   Si se requiere una autorizacin previa para que su compaa de seguros [place] su medicamento, por favor permtanos de 1 a 2 das hbiles para completar este proceso.  Los precios de los medicamentos varan con frecuencia dependiendo del Environmental consultant de dnde se surte la receta y alguna farmacias pueden ofrecer precios ms baratos.  El sitio web www.goodrx.com tiene cupones para medicamentos de Airline pilot. Los precios aqu no tienen en cuenta lo que podra costar con la ayuda del seguro (puede ser ms barato con su seguro), pero el sitio web puede darle el precio si no utiliz Research scientist (physical sciences).  - Puede imprimir el cupn correspondiente y llevarlo con su receta a la farmacia.  - Tambin puede pasar por nuestra oficina durante el horario de  atencin regular y Charity fundraiser una tarjeta de cupones de GoodRx.  - Si necesita que su receta se enve electrnicamente a una farmacia diferente, informe a nuestra oficina a travs de MyChart de [place] o por telfono llamando al [phone] y presione la opcin 4.   Instructions for [facility] Medications  One or more of your medications was sent to the [facility] mail order compounding pharmacy. You will receive an email from them and can purchase the medicine through that link. It will then be mailed to your home at the address you confirmed. If for any reason you do not receive an email from them, please check your spam  folder. If you still do not find the email, please let us know. [facility] phone number is [phone].

## 2022-01-04 IMAGING — DX DG CHEST 1V PORT
1 series · 1 of 1 positions shown · non-contrast
Comparison: None.

CLINICAL DATA: cp

EXAM:
PORTABLE CHEST 1 VIEW

[chest ap]
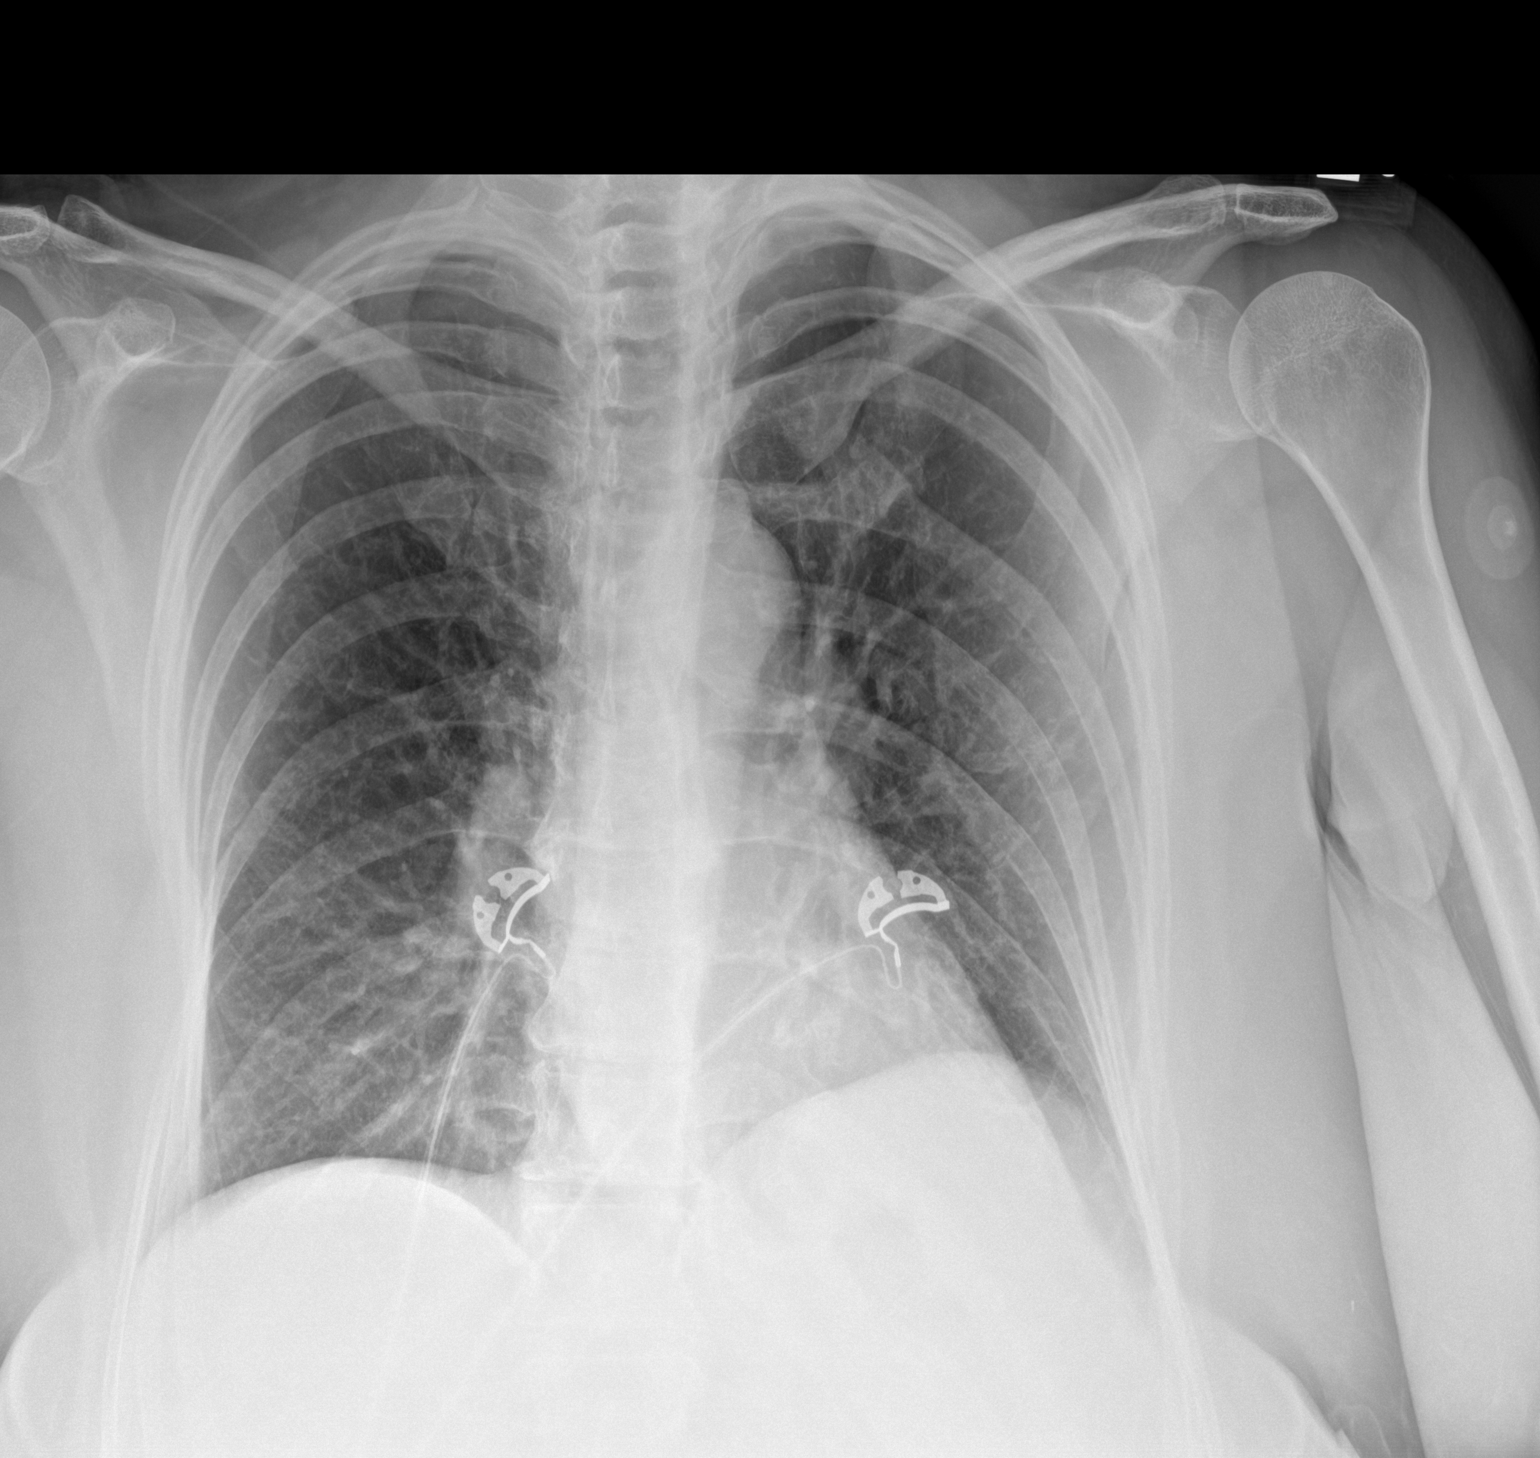

[1 of 1 positions shown; findings below may reference images not displayed]

FINDINGS: No pneumothorax or pleural effusion. Patchy right basilar opacities.
Cardiomediastinal silhouette within normal limits. No acute osseous
abnormality.
IMPRESSION: Patchy right basilar opacities, atelectasis versus infiltrate.

## 2022-02-01 ENCOUNTER — Ambulatory Visit (INDEPENDENT_AMBULATORY_CARE_PROVIDER_SITE_OTHER): Admitting: Dermatology

## 2022-02-01 ENCOUNTER — Encounter: Payer: Self-pay | Admitting: Dermatology

## 2022-02-01 VITALS — BP 146/94 | HR 62

## 2022-02-01 DIAGNOSIS — Z1283 Encounter for screening for malignant neoplasm of skin: Secondary | ICD-10-CM

## 2022-02-01 DIAGNOSIS — Z86018 Personal history of other benign neoplasm: Secondary | ICD-10-CM

## 2022-02-01 DIAGNOSIS — L719 Rosacea, unspecified: Secondary | ICD-10-CM | POA: Diagnosis not present

## 2022-02-01 DIAGNOSIS — L578 Other skin changes due to chronic exposure to nonionizing radiation: Secondary | ICD-10-CM

## 2022-02-01 DIAGNOSIS — D2262 Melanocytic nevi of left upper limb, including shoulder: Secondary | ICD-10-CM

## 2022-02-01 DIAGNOSIS — L821 Other seborrheic keratosis: Secondary | ICD-10-CM

## 2022-02-01 DIAGNOSIS — D229 Melanocytic nevi, unspecified: Secondary | ICD-10-CM

## 2022-02-01 DIAGNOSIS — L814 Other melanin hyperpigmentation: Secondary | ICD-10-CM

## 2022-02-01 DIAGNOSIS — B070 Plantar wart: Secondary | ICD-10-CM

## 2022-02-01 DIAGNOSIS — D2372 Other benign neoplasm of skin of left lower limb, including hip: Secondary | ICD-10-CM

## 2022-02-01 MED ORDER — METRONIDAZOLE 0.75 % EX CREA: TOPICAL_CREAM | CUTANEOUS | 11 refills | Status: AC

## 2022-02-01 NOTE — Progress Notes (Signed)
Follow-Up Visit   Subjective  Tammy Haynes is a 62 y.o. female who presents for the following: Annual Exam (Tbse, hx of dysplastic nevi,  hx of rosacea currently not on treatment but would like to discuss treatment, patient reports a plantar wart left foot, new spot at left breast, ).  The patient presents for Total-Body Skin Exam (TBSE) for skin cancer screening and mole check.  The patient has spots, moles and lesions to be evaluated, some may be new or changing and the patient has concerns that these could be cancer.   The following portions of the chart were reviewed this encounter and updated as appropriate:  Allergies  Meds  Problems  Med Hx  Surg Hx  Fam Hx      Review of Systems: No other skin or systemic complaints except as noted in HPI or Assessment and Plan.   Objective  Well appearing patient in no apparent distress; mood and affect are within normal limits.  A full examination was performed including scalp, head, eyes, ears, nose, lips, neck, chest, axillae, abdomen, back, buttocks, bilateral upper extremities, bilateral lower extremities, hands, feet, fingers, toes, fingernails, and toenails. All findings within normal limits unless otherwise noted below.  left foot x 2 (2) Verrucous papules -- Discussed viral etiology and contagion.   left upper arm 0.45 cm round thin brown papule   Head - Anterior (Face) Mid-face erythema with inflammatory papules and pustules   Assessment & Plan  Plantar wart (2) left foot x 2  Viral Wart (HPV) Counseling  Discussed viral / HPV (Human Papilloma Virus) etiology and risk of spread /infectivity to other areas of body as well as to other people.  Multiple treatments and methods may be required to clear warts and it is possible treatment may not be successful.  Treatment risks include discoloration; scarring and there is still potential for wart recurrence.  Discussed in office treatment vs at home  treatment  Patient prefers in office treatment.   Will treat with Ln2, cantharidin plus, and squaric acid 3 %.  Squaric Acid 3% applied to warts today. Prior to application reviewed risk of inflammation and irritation.  Prior to procedure, discussed risks of blister formation, small wound, skin dyspigmentation, or rare scar following cryotherapy. Recommend Vaseline ointment to treated areas while healing.  Cantharidin Plus is a blistering agent that comes from a beetle.  It needs to be washed off in about 4 hours after application.  Although it is painless when applied in office, it may cause symptoms of mild pain and burning several hours later.  Treated areas will swell and turn red, and blisters may form.  Vaseline and a bandaid may be applied until wound has healed.  Once healed, the skin may remain temporarily discolored.  It can take weeks to months for pigmentation to return to normal.  Advised to wash off with soap and water in 4 hours or sooner if it becomes tender before then.    Destruction of lesion - left foot x 2  Destruction method: cryotherapy   Informed consent: discussed and consent obtained   Lesion destroyed using liquid nitrogen: Yes   Cryotherapy cycles:  2 Outcome: patient tolerated procedure well with no complications   Post-procedure details: wound care instructions given   Additional details:  Prior to procedure, discussed risks of blister formation, small wound, skin dyspigmentation, or rare scar following cryotherapy. Recommend Vaseline ointment to treated areas while healing.   Destruction of lesion - left foot  x 2  Destruction method: chemical removal   Informed consent: discussed and consent obtained   Timeout:  patient name, date of birth, surgical site, and procedure verified Chemical destruction method: cantharidin   Chemical destruction method comment:  Plus Application time:  4 hours Procedure instructions: patient instructed to wash and dry area    Outcome: patient tolerated procedure well with no complications   Post-procedure details: wound care instructions given    Nevus left upper arm  Benign-appearing.  Observation.  Call clinic for new or changing lesions.  Recommend daily use of broad spectrum spf 30+ sunscreen to sun-exposed areas.    Rosacea Head - Anterior (Face)  Chronic and persistent condition with duration or expected duration over one year. Condition is bothersome/symptomatic for patient. Currently flared.  Rosacea is a chronic progressive skin condition usually affecting the face of adults, causing redness and/or acne bumps. It is treatable but not curable. It sometimes affects the eyes (ocular rosacea) as well. It may respond to topical and/or systemic medication and can flare with stress, sun exposure, alcohol, exercise, topical steroids (including hydrocortisone/cortisone 10) and some foods.  Daily application of broad spectrum spf 30+ sunscreen to face is recommended to reduce flares.  Denies grittiness of eyes  Ddx also includes demodex folliculitis  Discussed azaleic acid, soolantra cream (not covered), compounded skin medicinals creams  Start metronidazole 0.75 % cream - apply twice daily to affected areas of face for rosacea  If not improving will consider scraping for demodex mites to consider next step for treatment  metroNIDAZOLE (METROCREAM) 0.75 % cream - Head - Anterior (Face) Apply twice daily to affected areas for rosacea  Lentigines - Scattered tan macules - Due to sun exposure - Benign-appearing, observe - Recommend daily broad spectrum sunscreen SPF 30+ to sun-exposed areas, reapply every 2 hours as needed. - Call for any changes  Seborrheic Keratoses - Stuck-on, waxy, tan-brown papules and/or plaques  - Benign-appearing - Discussed benign etiology and prognosis. - Observe - Call for any changes  Melanocytic Nevi - Tan-brown and/or pink-flesh-colored symmetric macules and  papules - Benign appearing on exam today - Observation - Call clinic for new or changing moles - Recommend daily use of broad spectrum spf 30+ sunscreen to sun-exposed areas.   Dermatofibroma Left pretibia and left calf x 3 - Firm pink/brown papulenodule with dimple sign - Benign appearing - Call for any changes  Hemangiomas At left breast - Red papules - Discussed benign nature - Observe - Call for any changes  Actinic Damage - Chronic condition, secondary to cumulative UV/sun exposure - diffuse scaly erythematous macules with underlying dyspigmentation - Recommend daily broad spectrum sunscreen SPF 30+ to sun-exposed areas, reapply every 2 hours as needed.  - Staying in the shade or wearing long sleeves, sun glasses (UVA+UVB protection) and wide brim hats (4-inch brim around the entire circumference of the hat) are also recommended for sun protection.  - Call for new or changing lesions.  History of Dysplastic Nevi Left medial thigh 09/2018 moderate margins free - No evidence of recurrence today - Recommend regular full body skin exams - Recommend daily broad spectrum sunscreen SPF 30+ to sun-exposed areas, reapply every 2 hours as needed.  - Call if any new or changing lesions are noted between office visits  Skin cancer screening performed today. Return for 1 month recheck rosacea and warts , 1 year tbse . I, Ruthell Rummage, CMA, am acting as scribe for Forest Gleason, MD.  Documentation: I have reviewed  the above documentation for accuracy and completeness, and I agree with the above.  Forest Gleason, MD

## 2022-02-01 NOTE — Patient Instructions (Addendum)
Viral Wart (HPV) Counseling  Discussed viral / HPV (Human Papilloma Virus) etiology and risk of spread /infectivity to other areas of body as well as to other people.  Multiple treatments and methods may be required to clear warts and it is possible treatment may not be successful.  Treatment risks include discoloration; scarring and there is still potential for wart recurrence.  Cantharidin Plus is a blistering agent that comes from a beetle.  It needs to be washed off in about 4 hours after application.  Although it is painless when applied in office, it may cause symptoms of mild pain and burning several hours later.  Treated areas will swell and turn red, and blisters may form.  Vaseline and a bandaid may be applied until wound has healed.  Once healed, the skin may remain temporarily discolored.  It can take weeks to months for pigmentation to return to normal.  Advised to wash off with soap and water in 4 hours or sooner if it becomes tender before then.  Cryotherapy Aftercare  Wash gently with soap and water everyday.   Apply Vaseline and Band-Aid daily until healed.       Rosacea  What is rosacea? Rosacea (say: ro-zay-sha) is a common skin disease that usually begins as a trend of flushing or blushing easily.  As rosacea progresses, a persistent redness in the center of the face will develop and may gradually spread beyond the nose and cheeks to the forehead and chin.  In some cases, the ears, chest, and back could be affected.  Rosacea may appear as tiny blood vessels or small red bumps that occur in crops.  Frequently they can contain pus, and are called "pustules".  If the bumps do not contain pus, they are referred to as "papules".  Rarely, in prolonged, untreated cases of rosacea, the oil glands of the nose and cheeks may become permanently enlarged.  This is called rhinophyma, and is seen more frequently in men.  Signs and Risks In its beginning stages, rosacea tends to come and go,  which makes it difficult to recognize.  It can start as intermittent flushing of the face.  Eventually, blood vessels may become permanently visible.  Pustules and papules can appear, but can be mistaken for adult acne.  People of all races, ages, genders and ethnic groups are at risk of developing rosacea.  However, it is more common in women (especially around menopause) and adults with fair skin between the ages of 25 and 58.  Treatment Dermatologists typically recommend a combination of treatments to effectively manage rosacea.  Treatment can improve symptoms and may stop the progression of the rosacea.  Treatment may involve both topical and oral medications.  The tetracycline antibiotics are often used for their anti-inflammatory effect; however, because of the possibility of developing antibiotic resistance, they should not be used long term at full dose.  For dilated blood vessels the options include electrodessication (uses electric current through a small needle), laser treatment, and cosmetics to hide the redness.   With all forms of treatment, improvement is a slow process, and patients may not see any results for the first 3-4 weeks.  It is very important to avoid the sun and other triggers.  Patients must wear sunscreen daily.  Skin Care Instructions: Cleanse the skin with a mild soap such as CeraVe cleanser, Cetaphil cleanser, or Dove soap once or twice daily as needed. Moisturize with Eucerin Redness Relief Daily Perfecting Lotion (has a subtle green tint), CeraVe  Moisturizing Cream, or Oil of Olay Daily Moisturizer with sunscreen every morning and/or night as recommended. Makeup should be "non-comedogenic" (won't clog pores) and be labeled "for sensitive skin". Good choices for cosmetics are: Neutrogena, Almay, and Physician's Formula.  Any product with a green tint tends to offset a red complexion. If your eyes are dry and irritated, use artificial tears 2-3 times per day and cleanse the  eyelids daily with baby shampoo.  Have your eyes examined at least every 2 years.  Be sure to tell your eye doctor that you have rosacea. Alcoholic beverages tend to cause flushing of the skin, and may make rosacea worse. Always wear sunscreen, protect your skin from extreme hot and cold temperatures, and avoid spicy foods, hot drinks, and mechanical irritation such as rubbing, scrubbing, or massaging the face.  Avoid harsh skin cleansers, cleansing masks, astringents, and exfoliation. If a particular product burns or makes your face feel tight, then it is likely to flare your rosacea. If you are having difficulty finding a sunscreen that you can tolerate, you may try switching to a chemical-free sunscreen.  These are ones whose active ingredient is zinc oxide or titanium dioxide only.  They should also be fragrance free, non-comedogenic, and labeled for sensitive skin. Rosacea triggers may vary from person to person.  There are a variety of foods that have been reported to trigger rosacea.  Some patients find that keeping a diary of what they were doing when they flared helps them avoid triggers.      Melanoma ABCDEs  Melanoma is the most dangerous type of skin cancer, and is the leading cause of death from skin disease.  You are more likely to develop melanoma if you: Have light-colored skin, light-colored eyes, or red or blond hair Spend a lot of time in the sun Tan regularly, either outdoors or in a tanning bed Have had blistering sunburns, especially during childhood Have a close family member who has had a melanoma Have atypical moles or large birthmarks  Early detection of melanoma is key since treatment is typically straightforward and cure rates are extremely high if we catch it early.   The first sign of melanoma is often a change in a mole or a new dark spot.  The ABCDE system is a way of remembering the signs of melanoma.  A for asymmetry:  The two halves do not match. B for  border:  The edges of the growth are irregular. C for color:  A mixture of colors are present instead of an even brown color. D for diameter:  Melanomas are usually (but not always) greater than 26m - the size of a pencil eraser. E for evolution:  The spot keeps changing in size, shape, and color.  Please check your skin once per month between visits. You can use a small mirror in front and a large mirror behind you to keep an eye on the back side or your body.   If you see any new or changing lesions before your next follow-up, please call to schedule a visit.  Please continue daily skin protection including broad spectrum sunscreen SPF 30+ to sun-exposed areas, reapplying every 2 hours as needed when you're outdoors.   Staying in the shade or wearing long sleeves, sun glasses (UVA+UVB protection) and wide brim hats (4-inch brim around the entire circumference of the hat) are also recommended for sun protection.    Due to recent changes in healthcare laws, you may see results of your  pathology and/or laboratory studies on MyChart before the doctors have had a chance to review them. We understand that in some cases there may be results that are confusing or concerning to you. Please understand that not all results are received at the same time and often the doctors may need to interpret multiple results in order to provide you with the best plan of care or course of treatment. Therefore, we ask that you please give [place] 2 business days to thoroughly review all your results before contacting the office for clarification. Should we see a critical lab result, you will be contacted sooner.   If You Need Anything After Your Visit  If you have any questions or concerns for your doctor, please call our main line at [phone] and press option 4 to reach your doctor's medical assistant. If no one answers, please leave a voicemail as directed and we will return your call as soon as possible. Messages left  after 4 pm will be answered the following business day.   You may also send [place] a message via [place]. We typically respond to MyChart messages within 1-2 business days.  For prescription refills, please ask your pharmacy to contact our office. Our fax number is [phone].  If you have an urgent issue when the clinic is closed that cannot wait until the next business day, you can page your doctor at the number below.    Please note that while we do our best to be available for urgent issues outside of office hours, we are not available 24/7.   If you have an urgent issue and are unable to reach [place], you may choose to seek medical care at your doctor's office, retail clinic, urgent care center, or emergency room.  If you have a medical emergency, please immediately call 911 or go to the emergency department.  Pager Numbers  - Dr. Nehemiah Massed: [phone]  - Dr. Laurence Ferrari: [phone]  - Dr. Nicole Kindred: [phone]  In the event of inclement weather, please call our main line at [phone] for an update on the status of any delays or closures.  Dermatology Medication Tips: Please keep the boxes that topical medications come in in order to help keep track of the instructions about where and how to use these. Pharmacies typically print the medication instructions only on the boxes and not directly on the medication tubes.   If your medication is too expensive, please contact our office at [phone] option 4 or send [place] a message through [place].   We are unable to tell what your co-pay for medications will be in advance as this is different depending on your insurance coverage. However, we may be able to find a substitute medication at lower cost or fill out paperwork to get insurance to cover a needed medication.   If a prior authorization is required to get your medication covered by your insurance company, please allow [place] 1-2 business days to complete this process.  Drug prices often  vary depending on where the prescription is filled and some pharmacies may offer cheaper prices.  The website www.goodrx.com contains coupons for medications through different pharmacies. The prices here do not account for what the cost may be with help from insurance (it may be cheaper with your insurance), but the website can give you the price if you did not use any insurance.  - You can print the associated coupon and take it with your prescription to the pharmacy.  - You may also stop by  our office during regular business hours and pick up a GoodRx coupon card.  - If you need your prescription sent electronically to a different pharmacy, notify our office through [facility] or by phone at [phone] option 4.     Si Usted Necesita Algo Despus de Su Visita  Tambin puede enviarnos un mensaje a travs de Pharmacist, community. Por lo general respondemos a los mensajes de MyChart en el transcurso de 1 a 2 das hbiles.  Para renovar recetas, por favor pida a su farmacia que se ponga en contacto con nuestra oficina. Harland Dingwall de fax es [place] [phone].  Si tiene un asunto urgente cuando la clnica est cerrada y que no puede esperar hasta el siguiente da hbil, puede llamar/localizar a su doctor(a) al nmero que aparece a continuacin.   Por favor, tenga en cuenta que aunque hacemos todo lo posible para estar disponibles para asuntos urgentes fuera del horario de [place], no estamos disponibles las 24 horas del da, los 7 das de la [place].   Si tiene un problema urgente y no puede comunicarse con nosotros, puede optar por buscar atencin mdica  en el consultorio de su doctor(a), en una clnica privada, en un centro de atencin urgente o en una sala de emergencias.  Si tiene Engineering geologist, por favor llame inmediatamente al 911 o vaya a la sala de emergencias.  Nmeros de bper  - Dr. Nehemiah Massed: [phone]  - Dra. Moye: [phone]  - Dra. Nicole Kindred: [phone]  En caso  de inclemencias del [place], por favor llame a Johnsie Kindred principal al [phone] para una actualizacin sobre el [place] de cualquier retraso o cierre.  Consejos para la medicacin en dermatologa: Por favor, guarde las cajas en las que vienen los medicamentos de uso tpico para ayudarle a seguir las instrucciones sobre dnde y cmo usarlos. Las farmacias generalmente imprimen las instrucciones del medicamento slo en las cajas y no directamente en los tubos del [place].   Si su medicamento es muy caro, por favor, pngase en contacto con Zigmund Daniel llamando al [phone] y presione la opcin 4 o envenos un mensaje a travs de Pharmacist, community.   No podemos decirle cul ser su copago por los medicamentos por adelantado ya que esto es diferente dependiendo de la cobertura de su seguro. Sin embargo, es posible que podamos encontrar un medicamento sustituto a Electrical engineer un formulario para que el seguro cubra el medicamento que se considera necesario.   Si se requiere una autorizacin previa para que su compaa de seguros [place] su medicamento, por favor permtanos de 1 a 2 das hbiles para completar este proceso.  Los precios de los medicamentos varan con frecuencia dependiendo del Environmental consultant de dnde se surte la receta y alguna farmacias pueden ofrecer precios ms baratos.  El sitio web www.goodrx.com tiene cupones para medicamentos de Airline pilot. Los precios aqu no tienen en cuenta lo que podra costar con la ayuda del seguro (puede ser ms barato con su seguro), pero el sitio web puede darle el precio si no utiliz Research scientist (physical sciences).  - Puede imprimir el cupn correspondiente y llevarlo con su receta a la farmacia.  - Tambin puede pasar por nuestra oficina durante el horario de atencin regular y Charity fundraiser una tarjeta de cupones de GoodRx.  - Si necesita que su receta se enve electrnicamente a una farmacia diferente, informe a nuestra oficina a travs de MyChart de [place] o  por telfono llamando al [phone] y presione la opcin 4.

## 2022-02-28 ENCOUNTER — Ambulatory Visit: Admitting: Dermatology

## 2022-03-07 ENCOUNTER — Ambulatory Visit: Admitting: Dermatology

## 2022-12-06 ENCOUNTER — Telehealth: Payer: Self-pay

## 2022-12-06 NOTE — Telephone Encounter (Signed)
Patient advised of information per Dr. Roseanne Reno and will try over the counter Mediplast bandages. aw

## 2022-12-06 NOTE — Telephone Encounter (Signed)
Patient called today regarding a plantar wart Dr. Neale Burly was treating. Dr. Neale Burly offered at home treatment at office visit January of this year but patient declined. [place] and unable to come back into office. She is wanting to know if any of our providers would send in at home treatment since the wart is starting to develop again?
